# Patient Record
Sex: Female | Born: 1960 | Race: Black or African American | Hispanic: No | Marital: Single
Health system: Southern US, Community
[De-identification: ages and names within clinical notes are randomized; demographics above are authoritative.]

---

## 2018-09-09 ENCOUNTER — Emergency Department (HOSPITAL_COMMUNITY): Payer: Self-pay

## 2018-09-09 ENCOUNTER — Emergency Department (HOSPITAL_COMMUNITY)
Admission: EM | Admit: 2018-09-09 | Discharge: 2018-09-09 | Disposition: A | Discharge status: Home / Self Care | Payer: Self-pay | Attending: Emergency Medicine | Admitting: Emergency Medicine

## 2018-09-09 DIAGNOSIS — R479 Unspecified speech disturbances: Secondary | ICD-10-CM

## 2018-09-09 DIAGNOSIS — G20 Parkinson's disease: Secondary | ICD-10-CM | POA: Insufficient documentation

## 2018-09-09 DIAGNOSIS — R0789 Other chest pain: Secondary | ICD-10-CM | POA: Insufficient documentation

## 2018-09-09 DIAGNOSIS — R079 Chest pain, unspecified: Secondary | ICD-10-CM

## 2018-09-09 LAB — CBC
HEMATOCRIT: 37.6 % (ref 36.0–46.0)
Hemoglobin: 12.5 g/dL (ref 12.0–15.0)
MCH: 29.1 pg (ref 26.0–34.0)
MCHC: 33.2 g/dL (ref 30.0–36.0)
MCV: 87.6 fL (ref 80.0–100.0)
Platelets: 217 10*3/uL (ref 150–400)
RBC: 4.29 MIL/uL (ref 3.87–5.11)
RDW: 12.6 % (ref 11.5–15.5)
WBC: 4.1 10*3/uL (ref 4.0–10.5)
nRBC: 0 % (ref 0.0–0.2)

## 2018-09-09 LAB — BASIC METABOLIC PANEL
Anion gap: 12 (ref 5–15)
BUN: 11 mg/dL (ref 6–20)
CO2: 30 mmol/L (ref 22–32)
Calcium: 9.9 mg/dL (ref 8.9–10.3)
Chloride: 101 mmol/L (ref 98–111)
Creatinine, Ser: 1.04 mg/dL — ABNORMAL HIGH (ref 0.44–1.00)
GFR calc non Af Amer: 59 mL/min — ABNORMAL LOW (ref >60)
Glucose, Bld: 97 mg/dL (ref 70–99)
Potassium: 3.6 mmol/L (ref 3.5–5.1)
Sodium: 143 mmol/L (ref 135–145)

## 2018-09-09 LAB — URINALYSIS, ROUTINE W REFLEX MICROSCOPIC
Bilirubin Urine: NEGATIVE
Glucose, UA: NEGATIVE mg/dL
Hgb urine dipstick: NEGATIVE
Ketones, ur: NEGATIVE mg/dL
Leukocytes,Ua: NEGATIVE
Nitrite: NEGATIVE
PROTEIN: NEGATIVE mg/dL
Specific Gravity, Urine: 1.006 (ref 1.005–1.030)
pH: 6 (ref 5.0–8.0)

## 2018-09-09 LAB — CBG MONITORING, ED: Glucose-Capillary: 87 mg/dL (ref 70–99)

## 2018-09-09 LAB — I-STAT TROPONIN, ED: Troponin i, poc: 0.01 ng/mL (ref 0.00–0.08)

## 2018-09-09 LAB — TSH: TSH: 1.323 u[IU]/mL (ref 0.350–4.500)

## 2018-09-09 NOTE — ED Triage Notes (Signed)
Pt arrived from [facility] [place] where staff reported pt exhibiting weakness that began this morning. Pt arrived leaning to the left on EMS stretcher; EMS reported equally weak strength bilaterally. EMS reported no staff was present at facility to report pt hx or further explain present state of pt. Pt is alert and oriented x4 at time of triage.

## 2018-09-09 NOTE — ED Notes (Signed)
Pt IV removed by PTAR prior to leaving [facility] ED.

## 2018-09-09 NOTE — ED Provider Notes (Signed)
[facility] Emergency Department Provider Note MRN:  [id number]  Arrival date & time: 09/09/18     Chief Complaint   Weakness   History of Present Illness   Angelica Callahan is a 58 y.o. year-old female with a history of Parkinson's, diabetes presenting to the ED with chief complaint of weakness.  Patient endorsing generalized weakness since she woke up this morning.  Also with headache since she woke up this morning.  Also with sensation of slurred speech upon awakening this morning.  Endorsing left-sided chest pain, 3 out of 10 in severity, constant for the past 2 days.  Denies fever or cough, no shortness of breath, no abdominal pain, no numbness weakness to the arms or legs.  Review of Systems  A complete 10 system review of systems was obtained and all systems are negative except as noted in the HPI and PMH.   Patient's Health History   Past medical history: Parkinson's, diabetes Social history: Lives in a care facility   No family history on file.  Social History   Socioeconomic History  . Marital status: Single    Spouse name: Not on file  . Number of children: Not on file  . Years of education: Not on file  . Highest education level: Not on file  Occupational History  . Not on file  Social Needs  . Financial resource strain: Not on file  . Food insecurity:    Worry: Not on file    Inability: Not on file  . Transportation needs:    Medical: Not on file    Non-medical: Not on file  Tobacco Use  . Smoking status: Not on file  Substance and Sexual Activity  . Alcohol use: Not on file  . Drug use: Not on file  . Sexual activity: Not on file  Lifestyle  . Physical activity:    Days per week: Not on file    Minutes per session: Not on file  . Stress: Not on file  Relationships  . Social connections:    Talks on phone: Not on file    Gets together: Not on file    Attends religious service: Not on file    Active member of club or organization:  Not on file    Attends meetings of clubs or organizations: Not on file    Relationship status: Not on file  . Intimate partner violence:    Fear of current or ex partner: Not on file    Emotionally abused: Not on file    Physically abused: Not on file    Forced sexual activity: Not on file  Other Topics Concern  . Not on file  Social History Narrative  . Not on file     Physical Exam  Vital Signs and Nursing Notes reviewed Vitals:   09/09/18 1830 09/09/18 2153  BP: 125/79 (!) 148/76  Pulse: 79 76  Resp: 15 20  Temp:    SpO2: 98% 98%    CONSTITUTIONAL: Well-appearing, NAD NEURO:  Alert and oriented x 3, normal and symmetric strength and sensation, mild impaired speech (unsure of baseline) EYES:  eyes equal and reactive ENT/NECK:  no LAD, no JVD CARDIO: Regular rate, well-perfused, normal S1 and S2 PULM:  CTAB no wheezing or rhonchi GI/GU:  normal bowel sounds, non-distended, non-tender MSK/SPINE:  No gross deformities, no edema SKIN:  no rash, atraumatic PSYCH:  Appropriate speech and behavior  Diagnostic and Interventional Summary    EKG Interpretation  Date/Time:  Wednesday September 09 2018 15:24:36 EST Ventricular Rate:  83 PR Interval:    QRS Duration: 82 QT Interval:  388 QTC Calculation: 456 R Axis:   -20 Text Interpretation:  Sinus rhythm Borderline left axis deviation Abnormal R-wave progression, early transition ST elevation, consider inferior injury Confirmed by Kennis Carina [phone]) on 09/09/2018 4:30:19 PM      Labs Reviewed  BASIC METABOLIC PANEL - Abnormal; Notable for the following components:      Result Value   Creatinine, Ser 1.04 (*)    GFR calc non Af Amer 59 (*)    All other components within normal limits  URINALYSIS, ROUTINE W REFLEX MICROSCOPIC - Abnormal; Notable for the following components:   Color, Urine STRAW (*)    All other components within normal limits  CBC  TSH  I-STAT TROPONIN, ED  CBG MONITORING, ED    MR BRAIN WO  CONTRAST  Final Result    CT HEAD WO CONTRAST  Final Result    DG Chest 2 View  Final Result      Medications - No data to display   Procedures Critical Care  ED Course and Medical Decision Making  I have reviewed the triage vital signs and the nursing notes.  Pertinent labs & imaging results that were available during my care of the patient were reviewed by me and considered in my medical decision making (see below for details).  Considering ACS, low concern for PE, seems less likely but given possible neurological deficit and chest pain should also consider dissection.  Work-up pending.  Clinical Course as of Sep 09 2230  Wed Sep 09, 2018  1624 Patient is without objective neurological deficits on exam at this time, but is endorsing slurred speech upon awakening this morning.  No vision loss, no aphasia, no neglect, not a code stroke candidate, will start with CT head, consider MRI.   [MB]    Clinical Course User Index [MB] Sabas Sous, MD    CT head unremarkable, MRI normal.  Labs reassuring, troponin negative.  Patient's symptoms are resolved, she feels well, given her spontaneous improvement in normal work-up there is little to no concern for significant or emergent process.  Appropriate for discharge.  After the discussed management above, the patient was determined to be safe for discharge.  The patient was in agreement with this plan and all questions regarding their care were answered.  ED return precautions were discussed and the patient will return to the ED with any significant worsening of condition.  Elmer Sow. Pilar Plate, MD [facility] Emergency Medicine [facility] [email or web address]  Final Clinical Impressions(s) / ED Diagnoses     ICD-10-CM   1. Speech disturbance, unspecified type R47.9   2. Chest pain R07.9 DG Chest 2 View    DG Chest 2 View    ED Discharge Orders    None         Sabas Sous, MD 09/09/18 2234

## 2018-09-09 NOTE — ED Notes (Signed)
Patient transported to MRI 

## 2018-09-09 NOTE — Discharge Instructions (Addendum)
You were evaluated in the Emergency Department and after careful evaluation, we did not find any emergent condition requiring admission or further testing in the hospital.  Your blood work today was reassuring.  We also did a CT scan and MRI of your brain which was normal.  Please return to the Emergency Department if you experience any worsening of your condition.  We encourage you to follow up with a primary care provider.  Thank you for allowing [place] to be a part of your care.

## 2018-09-09 NOTE — ED Notes (Signed)
Ptar called for pt 

## 2018-09-09 NOTE — ED Notes (Signed)
Pt care transferred to [facility] for transport back to home facility. Report attempted x3 to [phone] without successful connection to facility personnel. RN Followed phone prompts to "administrator on-call" but was only connected to voicemail. No VM left. ED CN made aware.

## 2019-10-05 IMAGING — CT CT HEAD W/O CM
4 series · 17 of 47 positions shown, 19 images · non-contrast
Comparison: None.

CLINICAL DATA: Headache. Generalized weakness since this morning.
Possible slurred speech. No reported injury.

EXAM:
CT HEAD WITHOUT CONTRAST
TECHNIQUE: Contiguous axial images were obtained from the base of the skull
through the vertex without intravenous contrast.

[Series 3: head wo · axial · 0.42mm/px · z∈[-104,+22]mm · 7 of 35 slices shown, 9 images]
[im 5/35  brain]
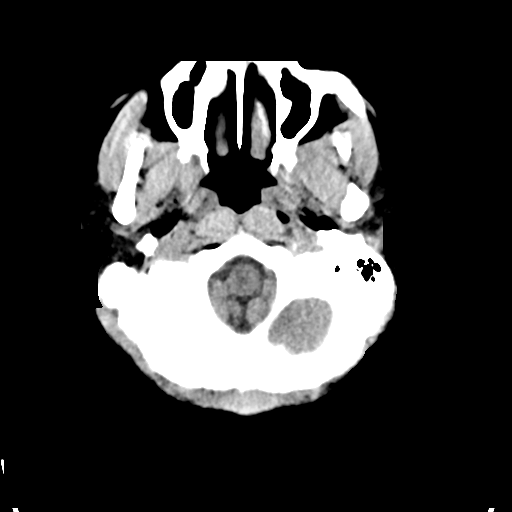
[im 5/35  bone]
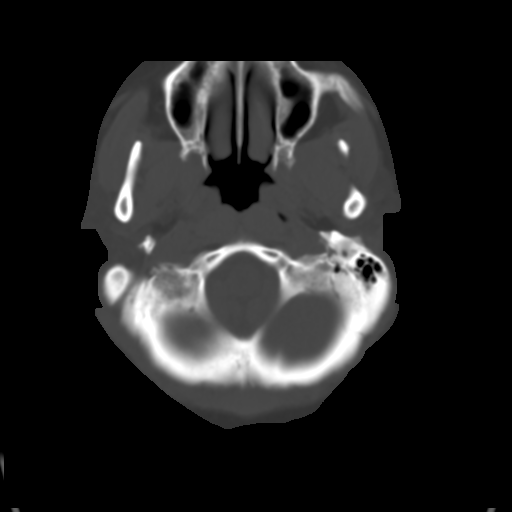
[im 9/35  brain]
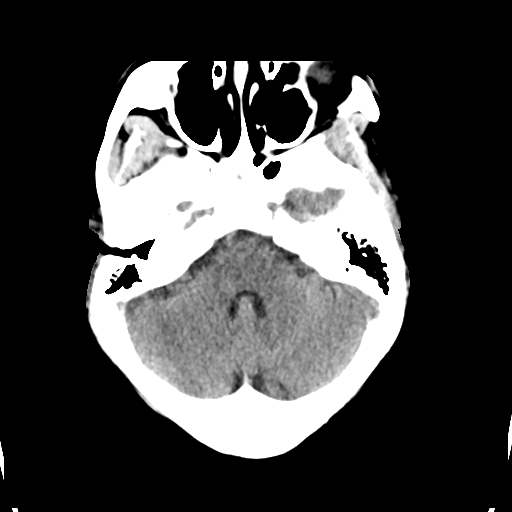
[im 13/35  brain]
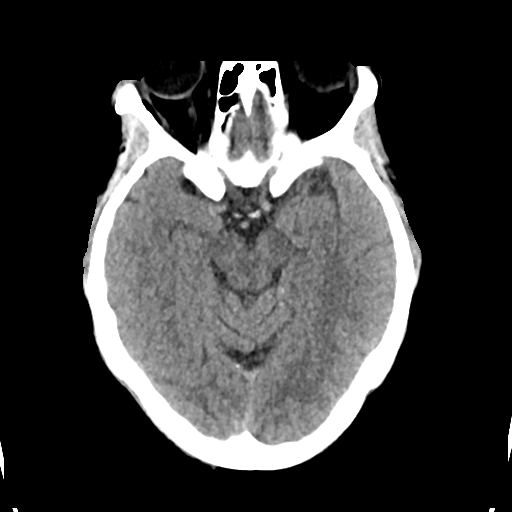
[im 18/35  brain]
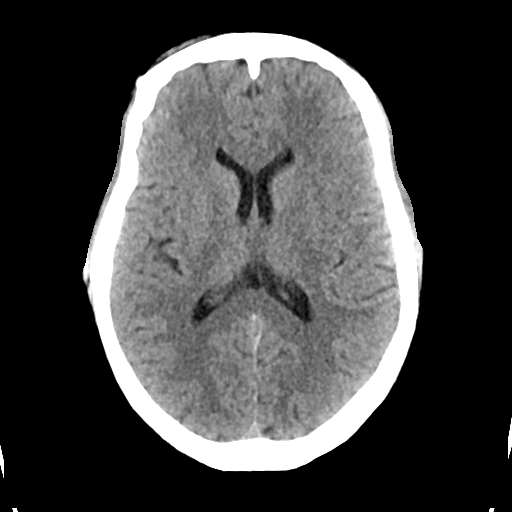
[im 22/35  brain]
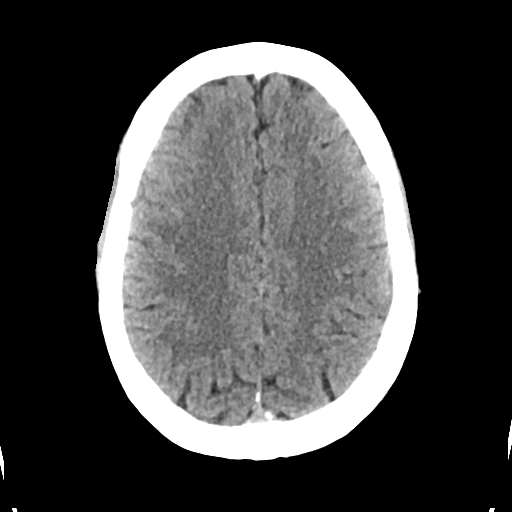
[im 22/35  bone]
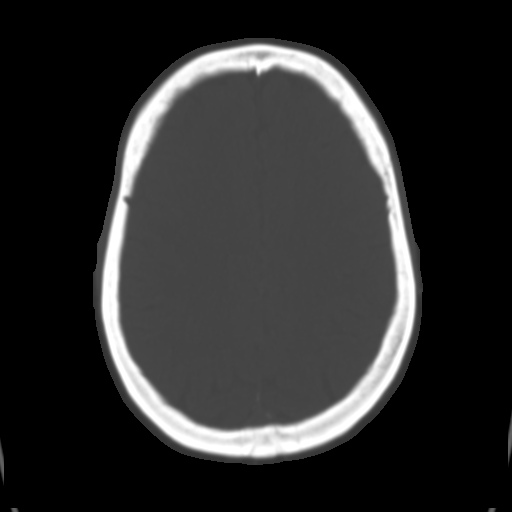
[im 26/35  brain]
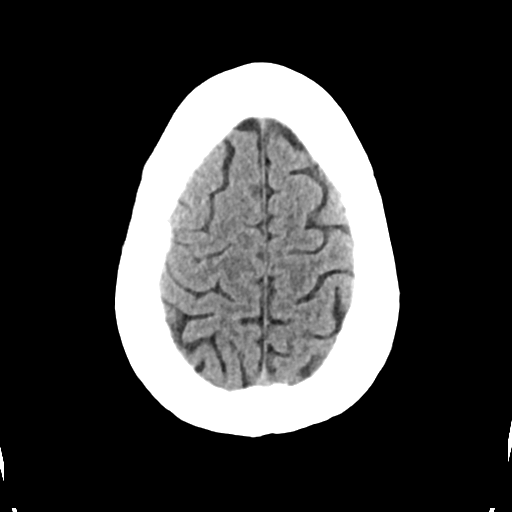
[im 30/35  brain]
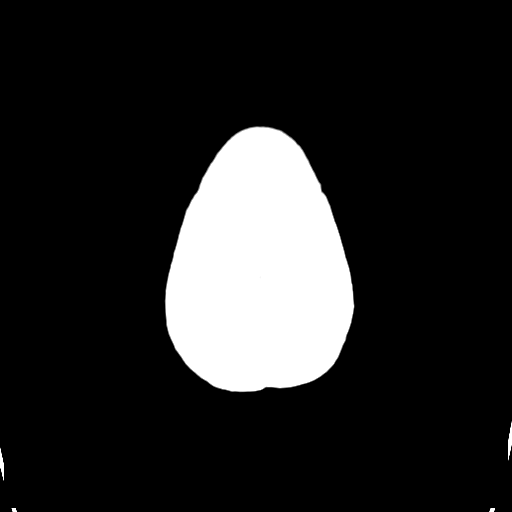

[Series 4: head bone · axial · 0.42mm/px · z∈[-108,-48]mm · 4 of 86 slices shown]
[im 9/86  bone]
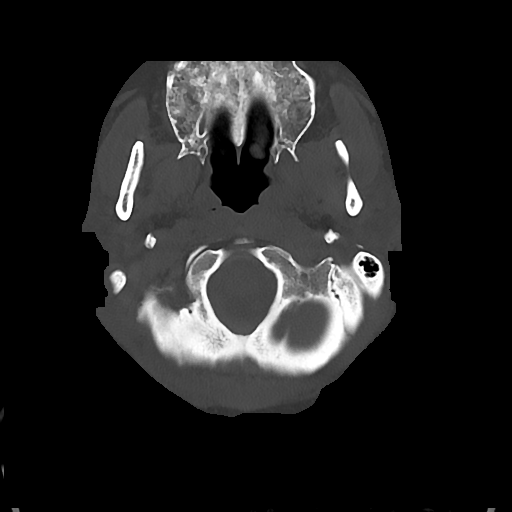
[im 18/86  bone]
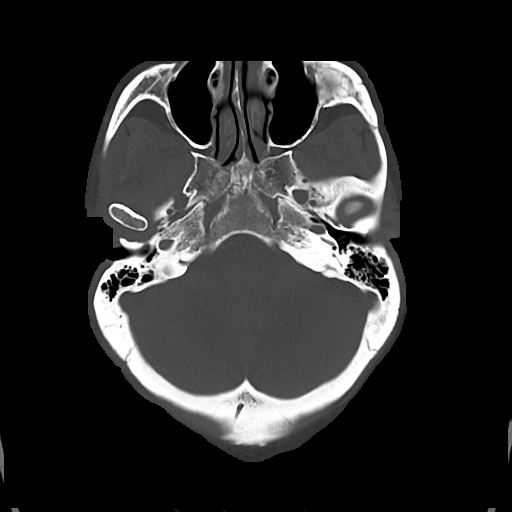
[im 26/86  bone]
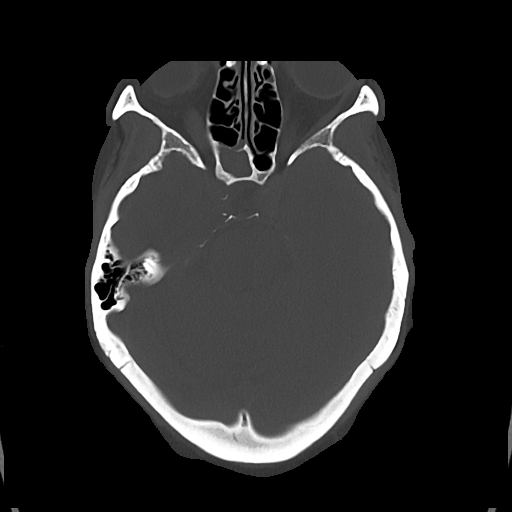
[im 39/86  bone]
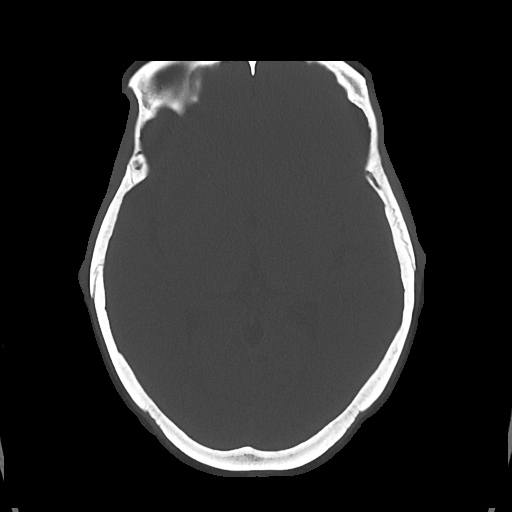

[Series 5: cor soft · coronal · 0.32mm/px · 3 of 67 slices shown]
[im 23/67  brain]
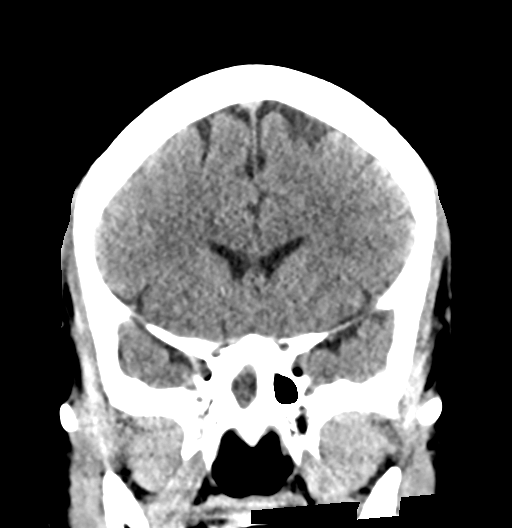
[im 30/67  brain]
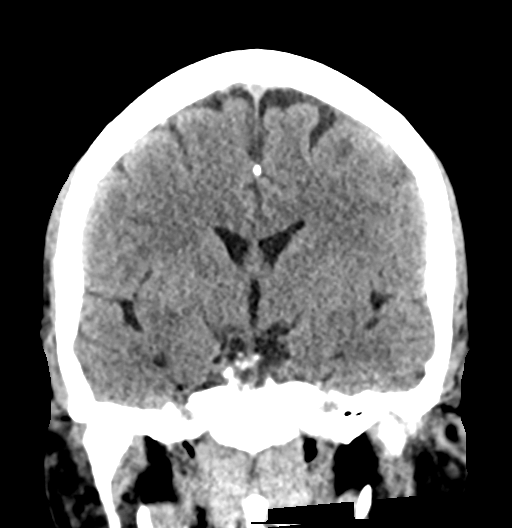
[im 37/67  brain]
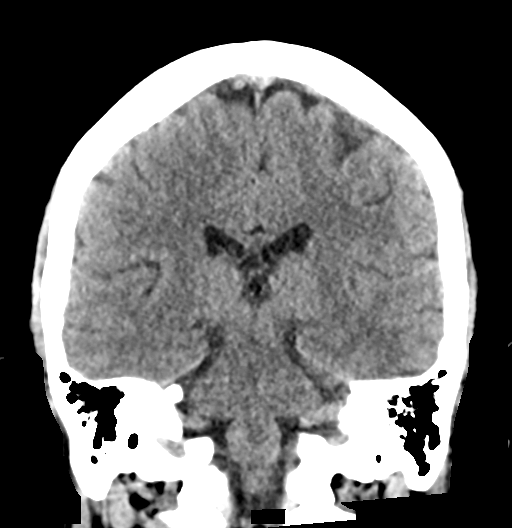

[Series 6: sag soft · sagittal · 0.33mm/px · 3 of 53 slices shown]
[im 18/53  brain]
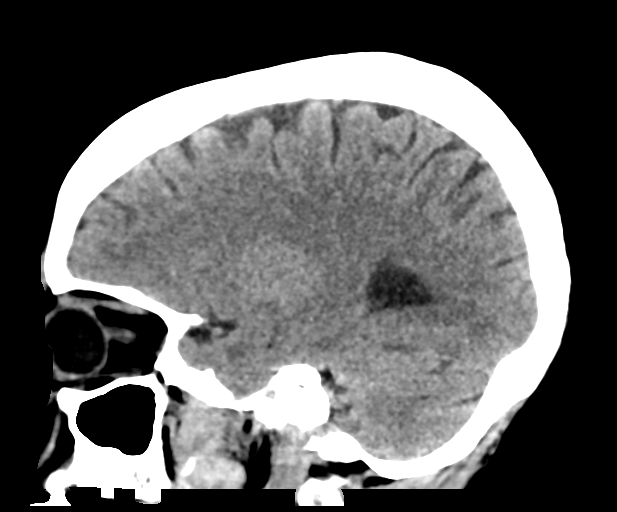
[im 27/53  brain]
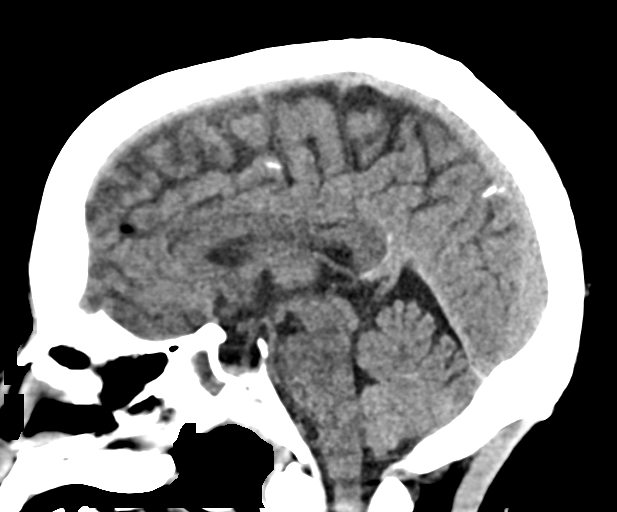
[im 35/53  brain]
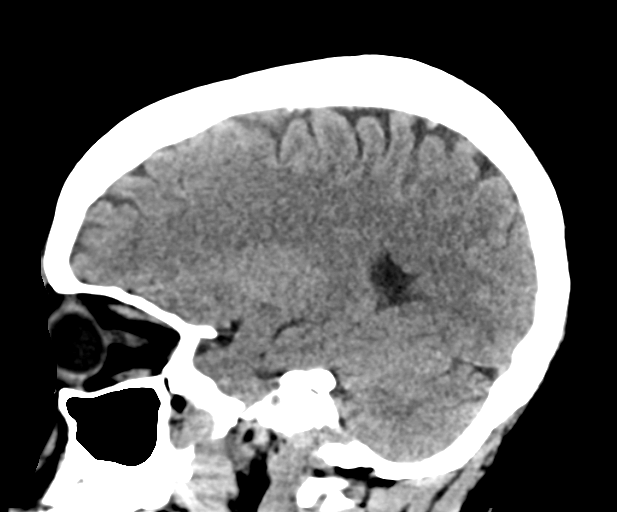

[17 of 47 positions shown; findings below may reference images not displayed]

FINDINGS: Brain: No evidence of parenchymal hemorrhage or extra-axial fluid
collection. No mass lesion, mass effect, or midline shift. No CT
evidence of acute infarction. Cerebral volume is age appropriate. No
ventriculomegaly.

Vascular: No acute abnormality.

Skull: No evidence of calvarial fracture.

Sinuses/Orbits: No fluid levels. Complete opacification of the right
sphenoid sinus with associated slight right sinus wall hyperostosis.

Other:  The mastoid air cells are unopacified.
IMPRESSION: 1. No evidence of acute intracranial abnormality.
2. Chronic right sphenoid sinusitis.

## 2019-10-05 IMAGING — DX DG CHEST 2V
2 series · 2 of 2 positions shown · non-contrast
Comparison: None.

CLINICAL DATA: Weakness beginning this morning.

EXAM:
CHEST - 2 VIEW

[x chest ap]
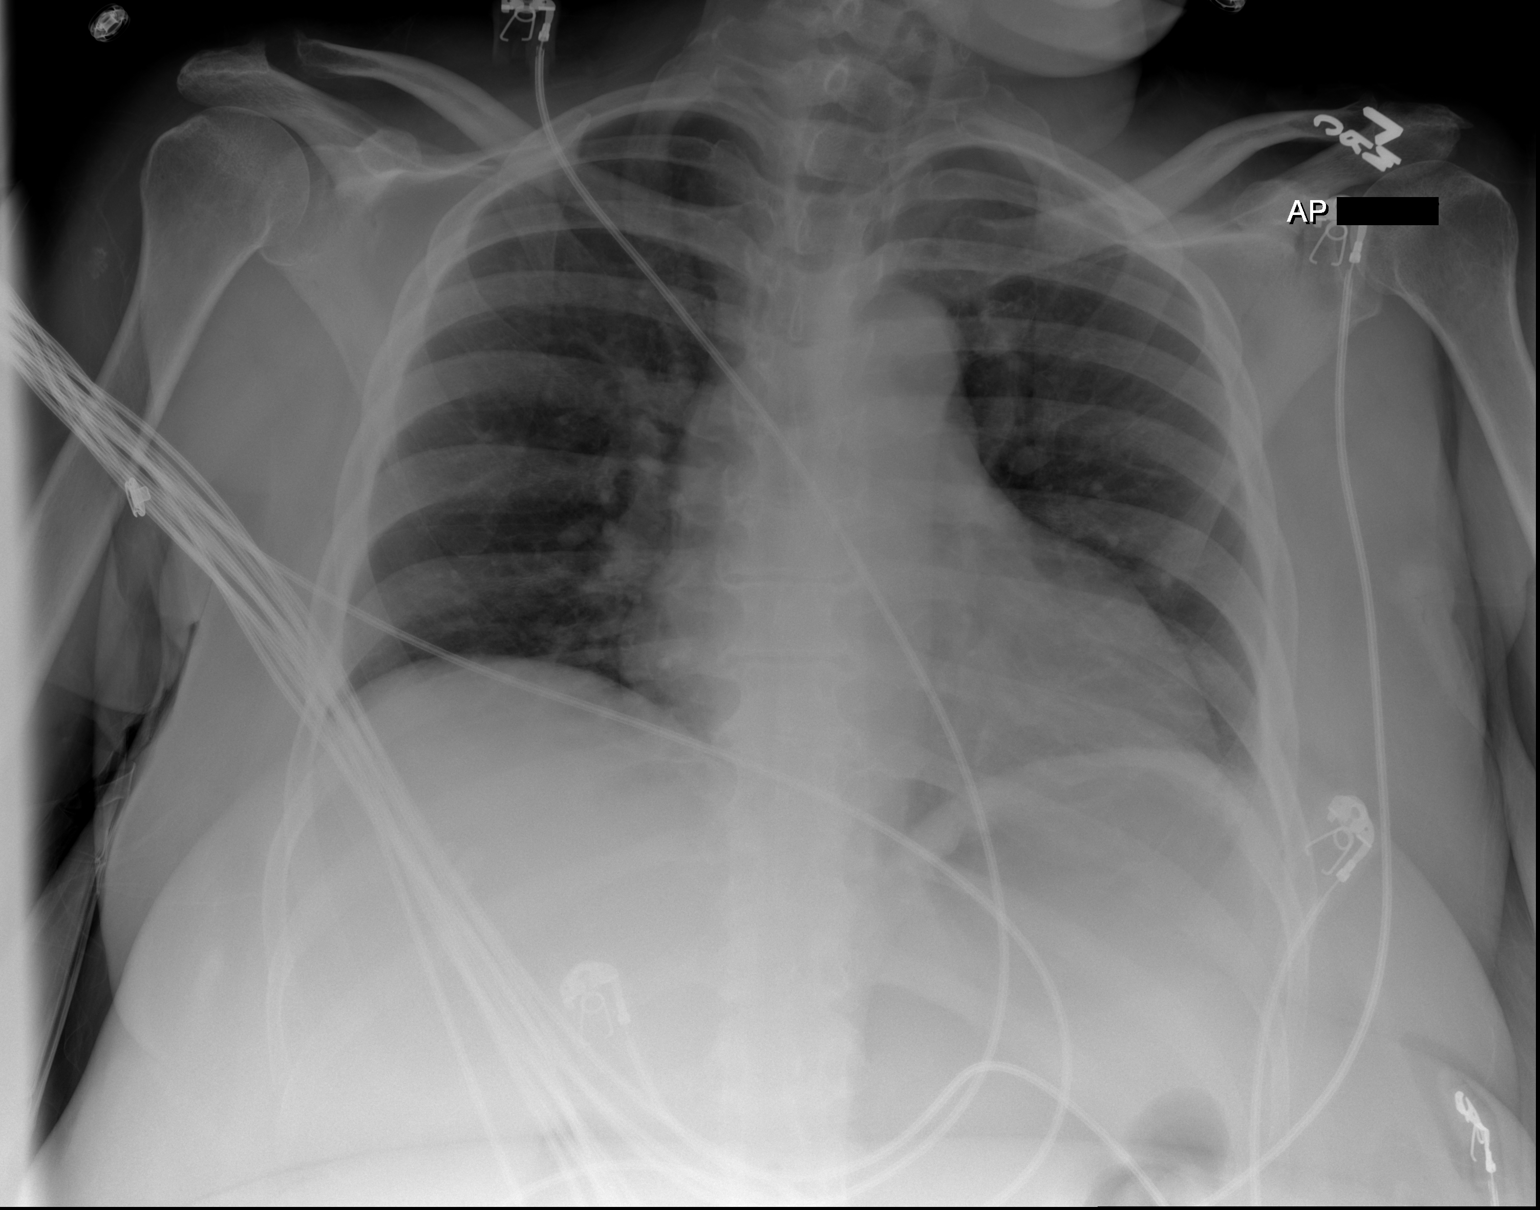

[w chest lat]
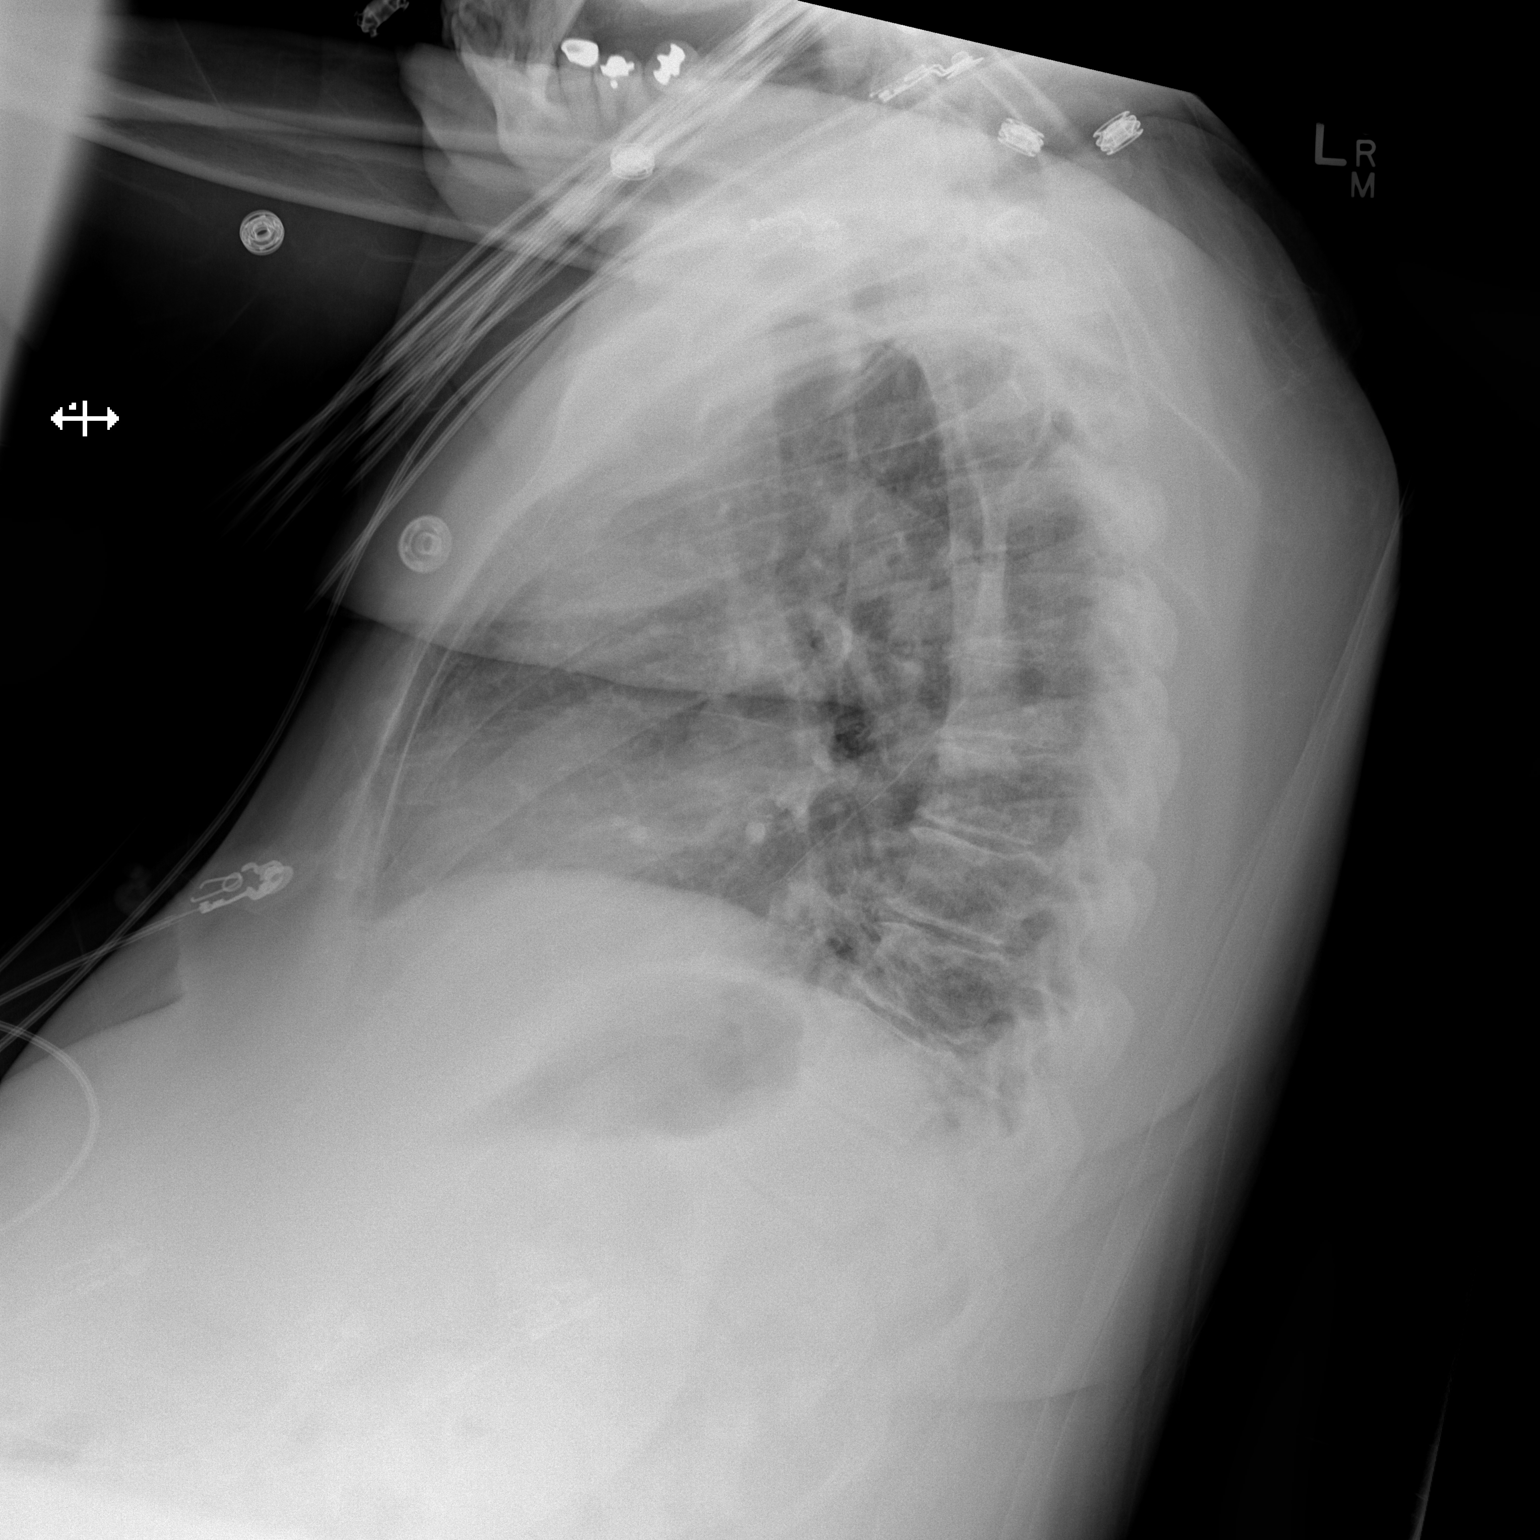

[2 of 2 positions shown; findings below may reference images not displayed]

FINDINGS: The heart size and mediastinal contours are within normal limits.
Both lungs are clear. The visualized skeletal structures are
unremarkable.
IMPRESSION: No active cardiopulmonary disease.
